# Patient Record
Sex: Female | Born: 1960 | Race: Black or African American | Hispanic: No | Smoking: Former smoker
Health system: Southern US, Community
[De-identification: ages and names within clinical notes are randomized; demographics above are authoritative.]

## PROBLEM LIST (undated history)

## (undated) DIAGNOSIS — I639 Cerebral infarction, unspecified: Secondary | ICD-10-CM

## (undated) HISTORY — PX: CEREBRAL ANEURYSM REPAIR: SHX164

## (undated) HISTORY — PX: PORTA CATH INSERTION: CATH118285

## (undated) HISTORY — PX: APPENDECTOMY: SHX54

## (undated) HISTORY — PX: MASTECTOMY: SHX3

## (undated) HISTORY — PX: LYMPH NODE DISSECTION: SHX5087

## (undated) HISTORY — PX: BREAST BIOPSY: SHX20

---

## 2013-11-19 ENCOUNTER — Emergency Department (HOSPITAL_COMMUNITY): Payer: Self-pay

## 2013-11-19 ENCOUNTER — Other Ambulatory Visit: Payer: Self-pay

## 2013-11-19 ENCOUNTER — Encounter (HOSPITAL_COMMUNITY): Payer: Self-pay | Admitting: Emergency Medicine

## 2013-11-19 ENCOUNTER — Emergency Department (HOSPITAL_COMMUNITY)
Admission: EM | Admit: 2013-11-19 | Discharge: 2013-11-19 | Disposition: A | Discharge status: Home / Self Care | Payer: Self-pay | Attending: Emergency Medicine | Admitting: Emergency Medicine

## 2013-11-19 DIAGNOSIS — R296 Repeated falls: Secondary | ICD-10-CM | POA: Insufficient documentation

## 2013-11-19 DIAGNOSIS — Y929 Unspecified place or not applicable: Secondary | ICD-10-CM | POA: Insufficient documentation

## 2013-11-19 DIAGNOSIS — W19XXXA Unspecified fall, initial encounter: Secondary | ICD-10-CM

## 2013-11-19 DIAGNOSIS — Z8673 Personal history of transient ischemic attack (TIA), and cerebral infarction without residual deficits: Secondary | ICD-10-CM | POA: Insufficient documentation

## 2013-11-19 DIAGNOSIS — F1010 Alcohol abuse, uncomplicated: Secondary | ICD-10-CM | POA: Insufficient documentation

## 2013-11-19 DIAGNOSIS — M2550 Pain in unspecified joint: Secondary | ICD-10-CM | POA: Insufficient documentation

## 2013-11-19 DIAGNOSIS — F10929 Alcohol use, unspecified with intoxication, unspecified: Secondary | ICD-10-CM

## 2013-11-19 DIAGNOSIS — Y939 Activity, unspecified: Secondary | ICD-10-CM | POA: Insufficient documentation

## 2013-11-19 HISTORY — DX: Cerebral infarction, unspecified: I63.9

## 2013-11-19 LAB — CBC WITH DIFFERENTIAL/PLATELET
BASOS ABS: 0 10*3/uL (ref 0.0–0.1)
BASOS PCT: 1 % (ref 0–1)
EOS ABS: 0.1 10*3/uL (ref 0.0–0.7)
EOS PCT: 2 % (ref 0–5)
HEMATOCRIT: 37.9 % (ref 36.0–46.0)
Hemoglobin: 12.7 g/dL (ref 12.0–15.0)
Lymphocytes Relative: 38 % (ref 12–46)
Lymphs Abs: 2.4 10*3/uL (ref 0.7–4.0)
MCH: 33.3 pg (ref 26.0–34.0)
MCHC: 33.5 g/dL (ref 30.0–36.0)
MCV: 99.5 fL (ref 78.0–100.0)
MONO ABS: 0.6 10*3/uL (ref 0.1–1.0)
Monocytes Relative: 10 % (ref 3–12)
Neutro Abs: 3.2 10*3/uL (ref 1.7–7.7)
Neutrophils Relative %: 49 % (ref 43–77)
Platelets: 191 10*3/uL (ref 150–400)
RBC: 3.81 MIL/uL — ABNORMAL LOW (ref 3.87–5.11)
RDW: 14.6 % (ref 11.5–15.5)
WBC: 6.5 10*3/uL (ref 4.0–10.5)

## 2013-11-19 LAB — URINALYSIS, ROUTINE W REFLEX MICROSCOPIC
BILIRUBIN URINE: NEGATIVE
Glucose, UA: NEGATIVE mg/dL
Hgb urine dipstick: NEGATIVE
KETONES UR: NEGATIVE mg/dL
NITRITE: NEGATIVE
Protein, ur: NEGATIVE mg/dL
SPECIFIC GRAVITY, URINE: 1.012 (ref 1.005–1.030)
UROBILINOGEN UA: 1 mg/dL (ref 0.0–1.0)
pH: 5.5 (ref 5.0–8.0)

## 2013-11-19 LAB — COMPREHENSIVE METABOLIC PANEL
ALBUMIN: 3.7 g/dL (ref 3.5–5.2)
ALK PHOS: 69 U/L (ref 39–117)
ALT: 9 U/L (ref 0–35)
AST: 19 U/L (ref 0–37)
BUN: 8 mg/dL (ref 6–23)
CO2: 20 mEq/L (ref 19–32)
CREATININE: 0.68 mg/dL (ref 0.50–1.10)
Calcium: 8.4 mg/dL (ref 8.4–10.5)
Chloride: 104 mEq/L (ref 96–112)
GFR calc Af Amer: >90 mL/min (ref >90)
GFR calc non Af Amer: >90 mL/min (ref >90)
Glucose, Bld: 91 mg/dL (ref 70–99)
POTASSIUM: 3.9 meq/L (ref 3.7–5.3)
Sodium: 142 mEq/L (ref 137–147)
Total Protein: 7.3 g/dL (ref 6.0–8.3)

## 2013-11-19 LAB — URINE MICROSCOPIC-ADD ON

## 2013-11-19 LAB — I-STAT TROPONIN, ED: TROPONIN I, POC: 0.01 ng/mL (ref 0.00–0.08)

## 2013-11-19 LAB — I-STAT CG4 LACTIC ACID, ED
Lactic Acid, Venous: 3.16 mmol/L — ABNORMAL HIGH (ref 0.5–2.2)
Lactic Acid, Venous: 2.89 mmol/L — ABNORMAL HIGH (ref 0.5–2.2)

## 2013-11-19 LAB — RAPID URINE DRUG SCREEN, HOSP PERFORMED
Amphetamines: NOT DETECTED
Barbiturates: NOT DETECTED
Benzodiazepines: NOT DETECTED
Cocaine: NOT DETECTED
Opiates: NOT DETECTED
Tetrahydrocannabinol: POSITIVE — AB

## 2013-11-19 LAB — AMMONIA: Ammonia: 25 umol/L (ref 11–60)

## 2013-11-19 LAB — ETHANOL: ALCOHOL ETHYL (B): 116 mg/dL — AB (ref 0–11)

## 2013-11-19 MED ORDER — LORAZEPAM 2 MG/ML IJ SOLN
0.5000 mg | Freq: Once | INTRAMUSCULAR | Status: AC
  Administered 2013-11-19: 0.5 mg via INTRAVENOUS
  Filled 2013-11-19: qty 1

## 2013-11-19 MED ORDER — SODIUM CHLORIDE 0.9 % IV BOLUS (SEPSIS)
1000.0000 mL | Freq: Once | INTRAVENOUS | Status: AC
  Administered 2013-11-19: 1000 mL via INTRAVENOUS

## 2013-11-19 MED ORDER — SODIUM CHLORIDE 0.9 % IV BOLUS (SEPSIS)
500.0000 mL | Freq: Once | INTRAVENOUS | Status: AC
  Administered 2013-11-19: 500 mL via INTRAVENOUS

## 2013-11-19 MED ORDER — FENTANYL CITRATE 0.05 MG/ML IJ SOLN
50.0000 ug | Freq: Once | INTRAMUSCULAR | Status: AC
  Administered 2013-11-19: 50 ug via INTRAVENOUS
  Filled 2013-11-19: qty 2

## 2013-11-19 NOTE — ED Notes (Signed)
New family members at bedside (son & guest). Son asking about plan of care and PT status. PA and RN explained and asked permission to send back to x-ray since PT had pain medication on board now and was more comfortable. Family agreed. PT agreed with PA and RN prior to pain meds being administered

## 2013-11-19 NOTE — Discharge Instructions (Signed)
Alcohol and Nutrition °Nutrition serves two purposes. It provides energy. It also maintains body structure and function. Food supplies energy. It also provides the building blocks needed to replace worn or damaged cells. Alcoholics often eat poorly. This limits their supply of essential nutrients. This affects energy supply and structure maintenance. Alcohol also affects the body's nutrients in: °· Digestion. °· Storage. °· Using and getting rid of waste products. °IMPAIRMENT OF NUTRIENT DIGESTION AND UTILIZATION  °· Once ingested, food must be broken down into small components (digested). Then it is available for energy. It helps maintain body structure and function. Digestion begins in the mouth. It continues in the stomach and intestines, with help from the pancreas. The nutrients from digested food are absorbed from the intestines into the blood. Then they are carried to the liver. The liver prepares nutrients for: °· Immediate use. °· Storage and future use. °· Alcohol inhibits the breakdown of nutrients into usable molecules. °· It decreases secretion of digestive enzymes from the pancreas. °· Alcohol impairs nutrient absorption by damaging the cells lining the stomach and intestines. °· It also interferes with moving some nutrients into the blood. °· In addition, nutritional deficiencies themselves may lead to further absorption problems. °· For example, folate deficiency changes the cells that line the small intestine. This impairs how water is absorbed. It also affects absorbed nutrients. These include glucose, sodium, and additional folate. °· Even if nutrients are digested and absorbed, alcohol can prevent them from being fully used. It changes their transport, storage, and excretion. Impaired utilization of nutrients by alcoholics is indicated by: °· Decreased liver stores of vitamins, such as vitamin A. °· Increased excretion of nutrients such as fat. °ALCOHOL AND ENERGY SUPPLY  °· Three basic  nutritional components found in food are: °· Carbohydrates. °· Proteins. °· Fats. °· These are used as energy. Some alcoholics take in as much as 50% of their total daily calories from alcohol. They often neglect important foods. °· Even when enough food is eaten, alcohol can impair the ways the body controls blood sugar (glucose) levels. It may either increase or decrease blood sugar. °· In non-diabetic alcoholics, increased blood sugar (hyperglycemia) is caused by poor insulin secretion. It is usually temporary. °· Decreased blood sugar (hypoglycemia) can cause serious injury even if this condition is short-lived. Low blood sugar can happen when a fasting or malnourished person drinks alcohol. When there is no food to supply energy, stored sugar is used up. The products of alcohol inhibit forming glucose from other compounds such as amino acids. As a result, alcohol causes the brain and other body tissue to lack glucose. It is needed for energy and function. °· Alcohol is an energy source. But how the body processes and uses the energy from alcohol is complex. Also, when alcohol is substituted for carbohydrates, subjects tend to lose weight. This indicates that they get less energy from alcohol than from food. °ALCOHOL - MAINTAINING CELL STRUCTURE AND FUNCTION  °Structure °Cells are made mostly of protein. So an adequate protein diet is important for maintaining cell structure. This is especially true if cells are being damaged. Research indicates that alcohol affects protein nutrition by causing impaired: °· Digestion of proteins to amino acids. °· Processing of amino acids by the small intestine and liver. °· Synthesis of proteins from amino acids. °· Protein secretion by the liver. °Function °Nutrients are essential for the body to function well. They provide the tools that the body needs to work well:  °·   Proteins.  Vitamins.  Minerals. Alcohol can disrupt body function. It may cause nutrient  deficiencies. And it may interfere with the way nutrients are processed. Vitamins  Vitamins are essential to maintain growth and normal metabolism. They regulate many of the body`s processes. Chronic heavy drinking causes deficiencies in many vitamins. This is caused by eating less. And, in some cases, vitamins may be poorly absorbed. For example, alcohol inhibits fat absorption. It impairs how the vitamins A, E, and D are normally absorbed along with dietary fats. Not enough vitamin A may cause night blindness. Not enough vitamin D may cause softening of the bones.  Some alcoholics lack vitamins A, C, D, E, K, and the B vitamins. These are all involved in wound healing and cell maintenance. In particular, because vitamin K is necessary for blood clotting, lacking that vitamin can cause delayed clotting. The result is excess bleeding. Lacking other vitamins involved in brain function may cause severe neurological damage. Minerals Deficiencies of minerals such as calcium, magnesium, iron, and zinc are common in alcoholics. The alcohol itself does not seem to affect how these minerals are absorbed. Rather, they seem to occur secondary to other alcohol-related problems, such as:  Less calcium absorbed.  Not enough magnesium.  More urinary excretion.  Vomiting.  Diarrhea.  Not enough iron due to gastrointestinal bleeding.  Not enough zinc or losses related to other nutrient deficiencies.  Mineral deficiencies can cause a variety of medical consequences. These range from calcium-related bone disease to zinc-related night blindness and skin lesions. ALCOHOL, MALNUTRITION, AND MEDICAL COMPLICATIONS  Liver Disease   Alcoholic liver damage is caused primarily by alcohol itself. But poor nutrition may increase the risk of alcohol-related liver damage. For example, nutrients normally found in the liver are known to be affected by drinking alcohol. These include carotenoids, which are the major  sources of vitamin A, and vitamin E compounds. Decreases in such nutrients may play some role in alcohol-related liver damage. Pancreatitis  Research suggests that malnutrition may increase the risk of developing alcoholic pancreatitis. Research suggests that a diet lacking in protein may increase alcohol's damaging effect on the pancreas. Brain  Nutritional deficiencies may have severe effects on brain function. These may be permanent. Specifically, thiamine deficiencies are often seen in alcoholics. They can cause severe neurological problems. These include:  Impaired movement.  Memory loss seen in Wernicke-Korsakoff syndrome. Pregnancy  Alcohol has toxic effects on fetal development. It causes alcohol-related birth defects. They include fetal alcohol syndrome. Alcohol itself is toxic to the fetus. Also, the nutritional deficiency can affect how the fetus develops. That may compound the risk of developmental damage.  Nutritional needs during pregnancy are 10% to 30% greater than normal. Food intake can increase by as much as 140% to cover the needs of both mother and fetus. An alcoholic mother`s nutritional problems may adversely affect the nutrition of the fetus. And alcohol itself can also restrict nutrition flow to the fetus. NUTRITIONAL STATUS OF ALCOHOLICS  Techniques for assessing nutritional status include:  Taking body measurements to estimate fat reserves. They include:  Weight.  Height.  Mass.  Skin fold thickness.  Performing blood analysis to provide measurements of circulating:  Proteins.  Vitamins.  Minerals.  These techniques tend to be imprecise. For many nutrients, there is no clear "cut-off" point that would allow an accurate definition of deficiency. So assessing the nutritional status of alcoholics is limited by these techniques. Dietary status may provide information about the risk of developing nutritional problems.  Dietary status is assessed by:  Taking  patients' dietary histories.  Evaluating the amount and types of food they are eating.  It is difficult to determine what exact amount of alcohol begins to have damaging effects on nutrition. In general, moderate drinkers have 2 drinks or less per day. They seem to be at little risk for nutritional problems. Various medical disorders begin to appear at greater levels.  Research indicates that the majority of even the heaviest drinkers have few obvious nutritional deficiencies. Many alcoholics who are hospitalized for medical complications of their disease do have severe malnutrition. Alcoholics tend to eat poorly. Often they eat less than the amounts of food necessary to provide enough:  Carbohydrates.  Protein.  Fat.  Vitamins A and C.  B vitamins.  Minerals like calcium and iron. Of major concern is alcohol's effect on digesting food and use of nutrients. It may shift a mildly malnourished person toward severe malnutrition. Document Released: 04/09/2005 Document Revised: 09/07/2011 Document Reviewed: 09/23/2005 [facility] Patient Information 2014 [place]. Muscle Strain A muscle strain is an injury that occurs when a muscle is stretched beyond its normal length. Usually a small number of muscle fibers are torn when this happens. Muscle strain is rated in degrees. First-degree strains have the least amount of muscle fiber tearing and pain. Second-degree and third-degree strains have increasingly more tearing and pain.  Usually, recovery from muscle strain takes 1 2 weeks. Complete healing takes 5 6 weeks.  CAUSES  Muscle strain happens when a sudden, violent force placed on a muscle stretches it too far. This may occur with lifting, sports, or a fall.  RISK FACTORS Muscle strain is especially common in athletes.  SIGNS AND SYMPTOMS At the site of the muscle strain, there may be:  Pain.  Bruising.  Swelling.  Difficulty using the muscle due to pain or lack of normal  function. DIAGNOSIS  Your health care provider will perform a physical exam and ask about your medical history. TREATMENT  Often, the best treatment for a muscle strain is resting, icing, and applying cold compresses to the injured area.  HOME CARE INSTRUCTIONS   Use the PRICE method of treatment to promote muscle healing during the first 2 3 days after your injury. The PRICE method involves:  Protecting the muscle from being injured again.  Restricting your activity and resting the injured body part.  Icing your injury. To do this, put ice in a plastic bag. Place a towel between your skin and the bag. Then, apply the ice and leave it on from 15 20 minutes each hour. After the third day, switch to moist heat packs.  Apply compression to the injured area with a splint or elastic bandage. Be careful not to wrap it too tightly. This may interfere with blood circulation or increase swelling.  Elevate the injured body part above the level of your heart as often as you can.  Only take over-the-counter or prescription medicines for pain, discomfort, or fever as directed by your health care provider.  Warming up prior to exercise helps to prevent future muscle strains. SEEK MEDICAL CARE IF:   You have increasing pain or swelling in the injured area.  You have numbness, tingling, or a significant loss of strength in the injured area. MAKE SURE YOU:   Understand these instructions.  Will watch your condition.  Will get help right away if you are not doing well or get worse. Document Released: 06/15/2005 Document Revised: 04/05/2013 Document Reviewed: 01/12/2013  [employer] Patient Information 2014 [place].

## 2013-11-19 NOTE — ED Notes (Addendum)
Dr Tamera Punt given a copy of lactic acid results 2.89

## 2013-11-19 NOTE — ED Provider Notes (Signed)
CVA history. Right side deficits. Speech unclear at baseline. Heavy alcohol use. Fell while walking on sidewalk. ? AMS, improved with narcan via EMS. CT, xray, labs negative. LA 3, ETOH 160. UDS pending. 2 liters then repeat LA - if normal discharge home. LA high - admit for AMS and lactic acidosis.  Re-check - Lactic acid improving. She has stable mental status over time. She is able to answer questions appropriately. Family at bedside reports able to care for her at home. Doubt neurologic source of questionable AMS, likely due to alcohol and marijuana use. Stable for discharge.   Dewaine Oats, PA-C 11/19/13 1946

## 2013-11-19 NOTE — ED Notes (Signed)
Abnormal labs given to PA. Melanie Valentine  Lactic Acid 3.16 mmol/L

## 2013-11-19 NOTE — ED Provider Notes (Signed)
CSN: [id number]     Arrival date & time 11/19/13  1040 History   First MD Initiated Contact with Patient 11/19/13 1040     Chief Complaint  Patient presents with  . Altered Mental Status     (Consider location/radiation/quality/duration/timing/severity/associated sxs/prior Treatment) HPI Melanie Valentine is a 53 y.o. female who was brought in by EMS for altered mental status. According to EMS, patient was found walking on the side of the road and falling several times, by bystanders. [place]. Upon EMS arrival patient was going in and out of responsiveness. Patient received  Narcan and was more responsive after. Patient did admit to the drink alcohol, however denied it to me. 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 Patient is alert and talking, however speech is difficult to comprehend. Patient is tearful. She keeps saying she wants to go to heaven. She states when she fell she landed on her right arm and is having a right wrist and right hand pain as well as lumbar spine pain. She did not hit her head or lose consciousness. According to EMS patient's family is on the way to the hospital.  Past Medical History  Diagnosis Date  . Stroke     2014   History reviewed. No pertinent past surgical history. No family history on file. History  Substance Use Topics  . Smoking status: Unknown If Ever Smoked  . Smokeless tobacco: Not on file  . Alcohol Use: Yes   OB History   Grav Para Term Preterm Abortions TAB SAB Ect Mult Living                 Review of Systems  Unable to perform ROS: Mental status change  Musculoskeletal: Positive for arthralgias.  Neurological: Negative for dizziness.  All other systems reviewed and are negative.     Allergies  Review of patient's allergies indicates no known allergies.  Home Medications   Prior to Admission medications    Not on File   BP 119/82  Pulse 85  Temp(Src) 97.8 F (36.6 C) (Oral)  Resp 23  SpO2 92% Physical Exam  Nursing note and vitals reviewed. Constitutional: She appears well-developed and well-nourished.  Tearful  HENT:  Head: Normocephalic and atraumatic.  Mouth/Throat: Oropharynx is clear and moist. No oropharyngeal exudate.  Eyes: Conjunctivae are normal. Pupils are equal, round, and reactive to light.  Unable to look to the left  Neck: Normal range of motion. Neck supple.  Cardiovascular: Normal rate, regular rhythm and normal heart sounds.   Pulmonary/Chest: Effort normal and breath sounds normal. No respiratory distress. She has no wheezes. She has no rales.  Abdominal: Soft. Bowel sounds are normal. She exhibits no distension. There is no tenderness. There is no rebound.  Musculoskeletal: She exhibits no edema.  No swelling or bruising noted over her right forearm, wrist, hand. Tender to palpation diffusely over right wrist and hand. Tenderness over midline lumbar spine. Cervical and thoracic spine nontender.  Neurological: She is alert.  Unable to recall location, year of birth, current year, her name.  right arm and leg weakness, chronic. Unable to shrug right shoulder, refusing to look to the left, cranial nerves otherwise intact.  Skin: Skin is warm and dry.  Psychiatric: She has a normal mood and affect. Her behavior is normal.    ED Course  Procedures (including critical care time) Labs Review Labs  Reviewed  COMPREHENSIVE METABOLIC PANEL - Abnormal; Notable for the following:    Total Bilirubin <0.2 (*)    All other components within normal limits  URINALYSIS, ROUTINE W REFLEX MICROSCOPIC - Abnormal; Notable for the following:    APPearance CLOUDY (*)    Leukocytes, UA TRACE (*)    All other components within normal limits  URINE RAPID DRUG SCREEN (HOSP PERFORMED) - Abnormal; Notable for the following:    Tetrahydrocannabinol POSITIVE (*)    All other components  within normal limits  ETHANOL - Abnormal; Notable for the following:    Alcohol, Ethyl (B) 116 (*)    All other components within normal limits  URINE MICROSCOPIC-ADD ON - Abnormal; Notable for the following:    Squamous Epithelial / LPF FEW (*)    Casts HYALINE CASTS (*)    All other components within normal limits  CBC WITH DIFFERENTIAL - Abnormal; Notable for the following:    RBC 3.81 (*)    All other components within normal limits  I-STAT CG4 LACTIC ACID, ED - Abnormal; Notable for the following:    Lactic Acid, Venous 3.16 (*)    All other components within normal limits  I-STAT CG4 LACTIC ACID, ED - Abnormal; Notable for the following:    Lactic Acid, Venous 2.89 (*)    All other components within normal limits  URINE CULTURE  AMMONIA  CBC WITH DIFFERENTIAL  I-STAT TROPOININ, ED    Imaging Review Dg Chest 2 View  11/19/2013   CLINICAL DATA:  Altered mental status  EXAM: CHEST  2 VIEW  COMPARISON:  None.  FINDINGS: Low inspiratory volumes. The Lungs are clear and negative for focal airspace consolidation, pulmonary edema or suspicious pulmonary nodule. No pleural effusion or pneumothorax. Cardiac and mediastinal contours are within normal limits. No acute fracture or lytic or blastic osseous lesions. The visualized upper abdominal bowel gas pattern is unremarkable.  IMPRESSION: No active cardiopulmonary disease.   Electronically Signed   By: Jacqulynn Cadet M.D.   On: 11/19/2013 12:45   Dg Lumbar Spine Complete  11/19/2013   CLINICAL DATA:  Altered mental status.  Fall.  EXAM: LUMBAR SPINE - COMPLETE 4+ VIEW  COMPARISON:  None.  FINDINGS: There are 5 lumbar type vertebral bodies. There is a slight convex left curvature of the lumbar spine. Disc height narrowing at L4-L5 and L5-S1, with endplate spurring at these levels. No fracture or pars defect. Sacroiliac joints unremarkable. Bowel gas pattern is nonobstructive.  IMPRESSION: Mild convex left scoliosis of lumbar spine. No acute  bony abnormality.  Degenerative disc disease at L4-5 and L5-S1.   Electronically Signed   By: Curlene Dolphin M.D.   On: 11/19/2013 15:39   Dg Elbow Complete Left  11/19/2013   CLINICAL DATA:  Altered mental status.  Fall.  EXAM: LEFT ELBOW - COMPLETE 3+ VIEW  COMPARISON:  None.  FINDINGS: Intravenous catheter noted in the proximal left forearm. Elbow is located. No evidence joint effusion. Joint space is maintained. Negative for fracture focal osseous lesion.  IMPRESSION: Negative.   Electronically Signed   By: Curlene Dolphin M.D.   On: 11/19/2013 15:40   Dg Wrist Complete Right  11/19/2013   CLINICAL DATA:  Altered mental status, witnessed fall to right side  EXAM: RIGHT WRIST - COMPLETE 3+ VIEW  COMPARISON:  None.  FINDINGS: Flexion contracture of the right wrist. No acute fracture. 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 No inflammatory erosions. The soft tissues are unremarkable.  IMPRESSION: 1. No acute fracture or malalignment. 2. 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   Electronically Signed   By: Jacqulynn Cadet M.D.   On: 11/19/2013 12:46   Ct Head Wo Contrast  11/19/2013   CLINICAL DATA:  Status post fall.  EXAM: CT HEAD WITHOUT CONTRAST  TECHNIQUE: Contiguous axial images were obtained from the base of the skull through the vertex without intravenous contrast.  COMPARISON:  None.  FINDINGS: Encephalomalacia demonstrated within the left MCA territory. Ex vacuo dilatation left lateral ventricle. Aneurysm clipping material demonstrated within the left insular region. Encephalomalacia within the posterior right periventricular white matter. Subcortical and periventricular matter hypodensity compatible with chronic small vessel ischemic disease. No evidence for acute cortically based infarct, intracranial hemorrhage or significant mass effect. The orbits are unremarkable. Mastoid air cells are well aerated. Paranasal sinuses are unremarkable. Prior  left frontotemporal craniotomy.  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  No definite evidence for acute intracranial process.   Electronically Signed   By: Lovey Newcomer M.D.   On: 11/19/2013 11:54   Dg Hand Complete Right  11/19/2013   CLINICAL DATA:  Altered mental status. Pain. Arm contracted tight begins chest.  EXAM: RIGHT HAND - COMPLETE 3+ VIEW  COMPARISON:  None.  FINDINGS: The bones are diffusely osteopenic. The metacarpal phalangeal joints are distended, which may be due to contracture. No acute or healing fracture is identified. No significant joint space abnormality. No focal soft tissue swelling appreciated.  IMPRESSION: No acute bony abnormality. Osteopenia. Extension of the metacarpophalangeal joints may be due to contracture.   Electronically Signed   By: Curlene Dolphin M.D.   On: 11/19/2013 15:37     EKG Interpretation None      MDM   Final diagnoses:  Fall  Alcohol intoxication  History of CVA (cerebrovascular accident)      1:07 PM Delay in obtaining blood, pt uncooperative. Also pulled out her IV. I spoke with pt, she now agrees to both. Will get RN to try again.    2:22 PM Called the lab, apparently blood work is still in the tube in the tube station. Will run now  4:03 PM Alcohol elevated. Normal electrolytes otherwise. X-rays normal. According to family, pt is at baseline mental status wise. Lactic acid elevated. No signs of infection. Will rehydrate, 2L of NS ordered. Will recheck lactic acid.   Renold Genta, PA-C 11/20/13 1554

## 2013-11-19 NOTE — ED Notes (Signed)
MD at bedside. 

## 2013-11-19 NOTE — ED Provider Notes (Signed)
Medical screening examination/treatment/procedure(s) were performed by non-physician practitioner and as supervising physician I was immediately available for consultation/collaboration.   EKG Interpretation None        Malvin Johns, MD 11/19/13 2340

## 2013-11-19 NOTE — ED Notes (Addendum)
Per EMS- pt was found walking on the side of the road. Pt was seen by bystanders to fall and land on rt arm. They stated that she did not hit her head.  Pt was going in and out of responsiveness upon EMS arrival. Pt reports ETOH use today. Pt received 0.5mg  narcan. Pt has had previous stroke last year. Pt verbal but difficult to understand. Pt states that she does not speak. Pt states I want to go to heaven. Per EMS pt was asking for her mother who is dead. Pt will follow commands with prompting.

## 2013-11-19 NOTE — ED Notes (Signed)
PT pulled out IV in L hand. PT then refused new IV or blood draw. Provider notified and came to talk to PT. PT then agreed to care (IV Labs, x-rays).

## 2013-11-20 LAB — URINE CULTURE

## 2013-11-20 NOTE — ED Provider Notes (Signed)
Medical screening examination/treatment/procedure(s) were performed by non-physician practitioner and as supervising physician I was immediately available for consultation/collaboration.   EKG Interpretation None        Wandra Arthurs, MD 11/20/13 [phone]

## 2014-09-26 IMAGING — CR DG LUMBAR SPINE COMPLETE 4+V
5 series · 5 of 5 positions shown · non-contrast
Comparison: None.

CLINICAL DATA: Altered mental status.  Fall.

EXAM:
LUMBAR SPINE - COMPLETE 4+ VIEW

[t l-spine lat]
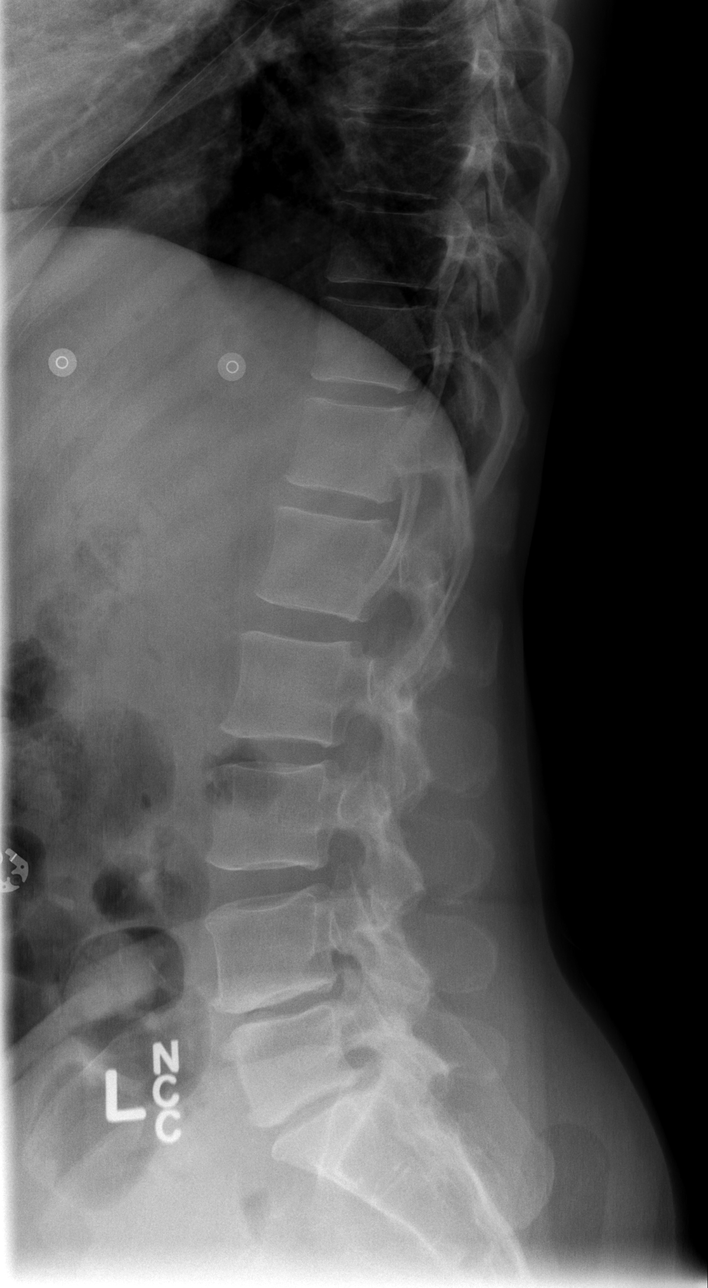

[t l-spine l5-s1 spot]
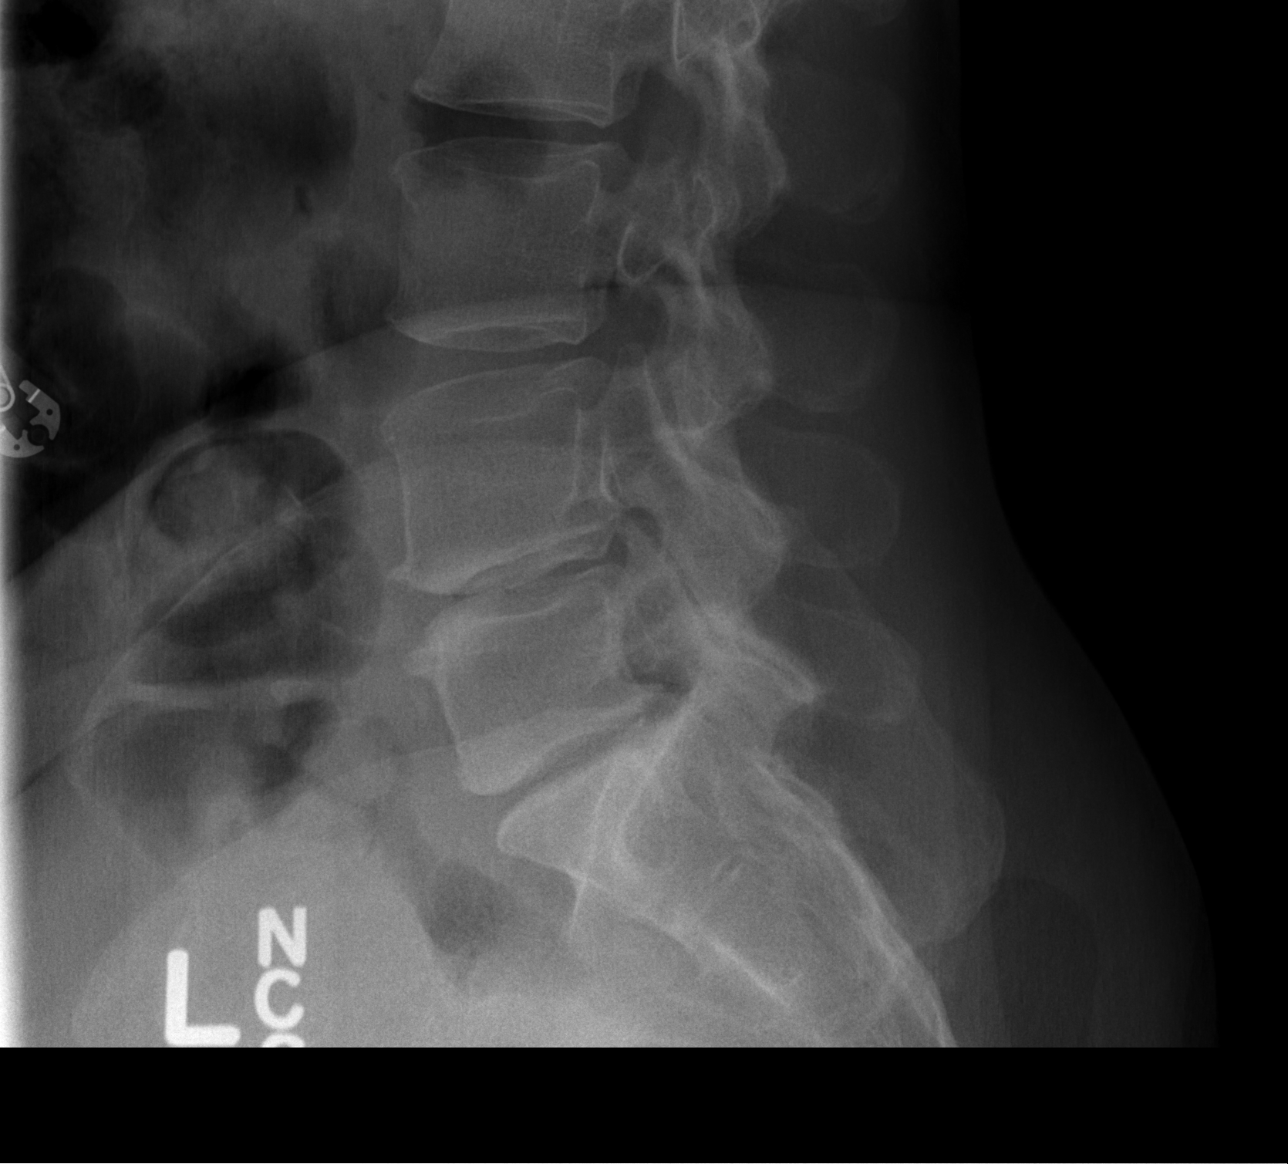

[t l-spine a.p.]
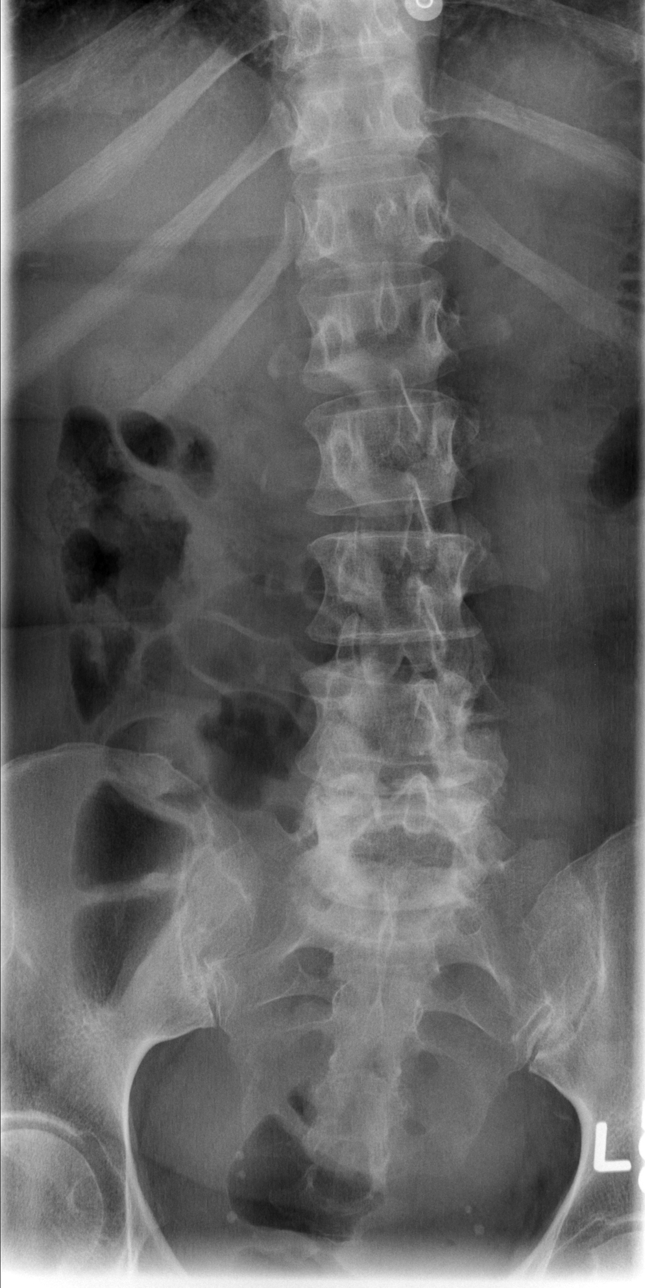

[t l-spine oblique exposure (1 of 2)]
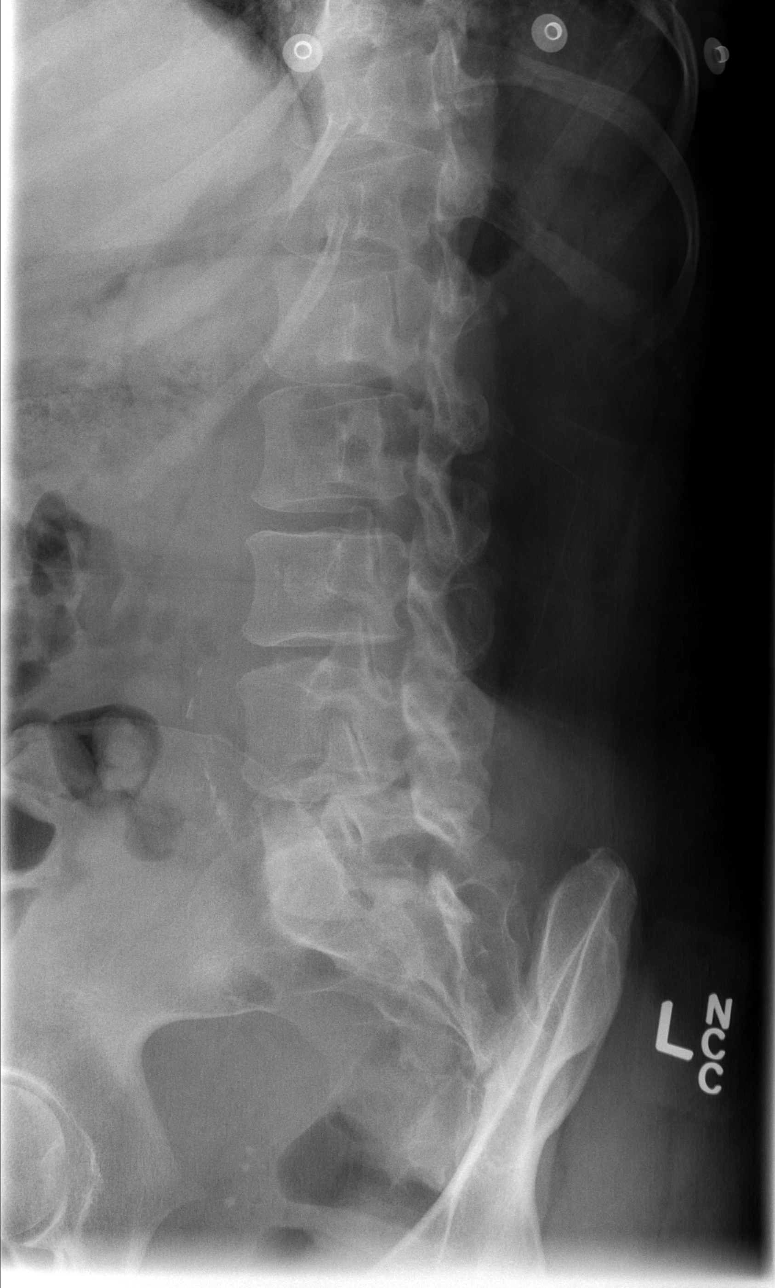

[t l-spine oblique exposure (2 of 2)]
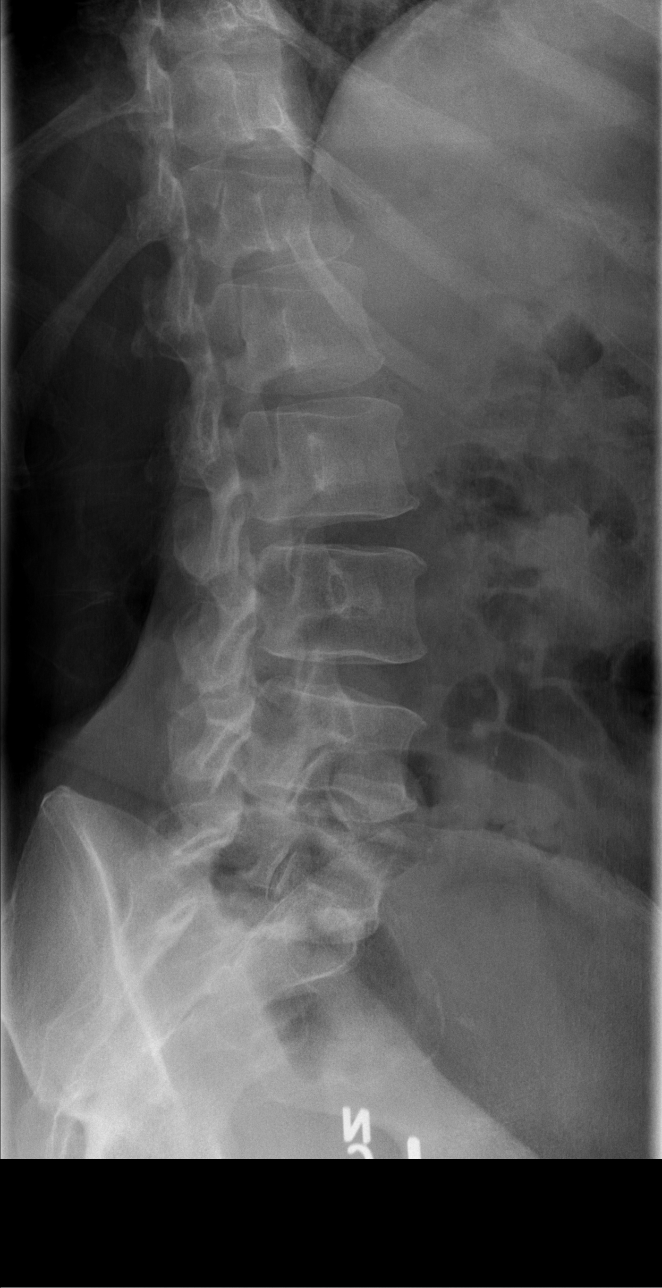

[5 of 5 positions shown; findings below may reference images not displayed]

FINDINGS: There are 5 lumbar type vertebral bodies. There is a slight convex
left curvature of the lumbar spine. Disc height narrowing at L4-L5
and L5-S1, with endplate spurring at these levels. No fracture or
pars defect. Sacroiliac joints unremarkable. Bowel gas pattern is
nonobstructive.
IMPRESSION: Mild convex left scoliosis of lumbar spine. No acute bony
abnormality.

Degenerative disc disease at L4-5 and L5-S1.

## 2014-09-26 IMAGING — CR DG HAND COMPLETE 3+V*R*
3 series · 3 of 3 positions shown · non-contrast
Comparison: None.

CLINICAL DATA: Altered mental status. Pain. Arm contracted tight
begins chest.

EXAM:
RIGHT HAND - COMPLETE 3+ VIEW

[view not recorded (1 of 3)]
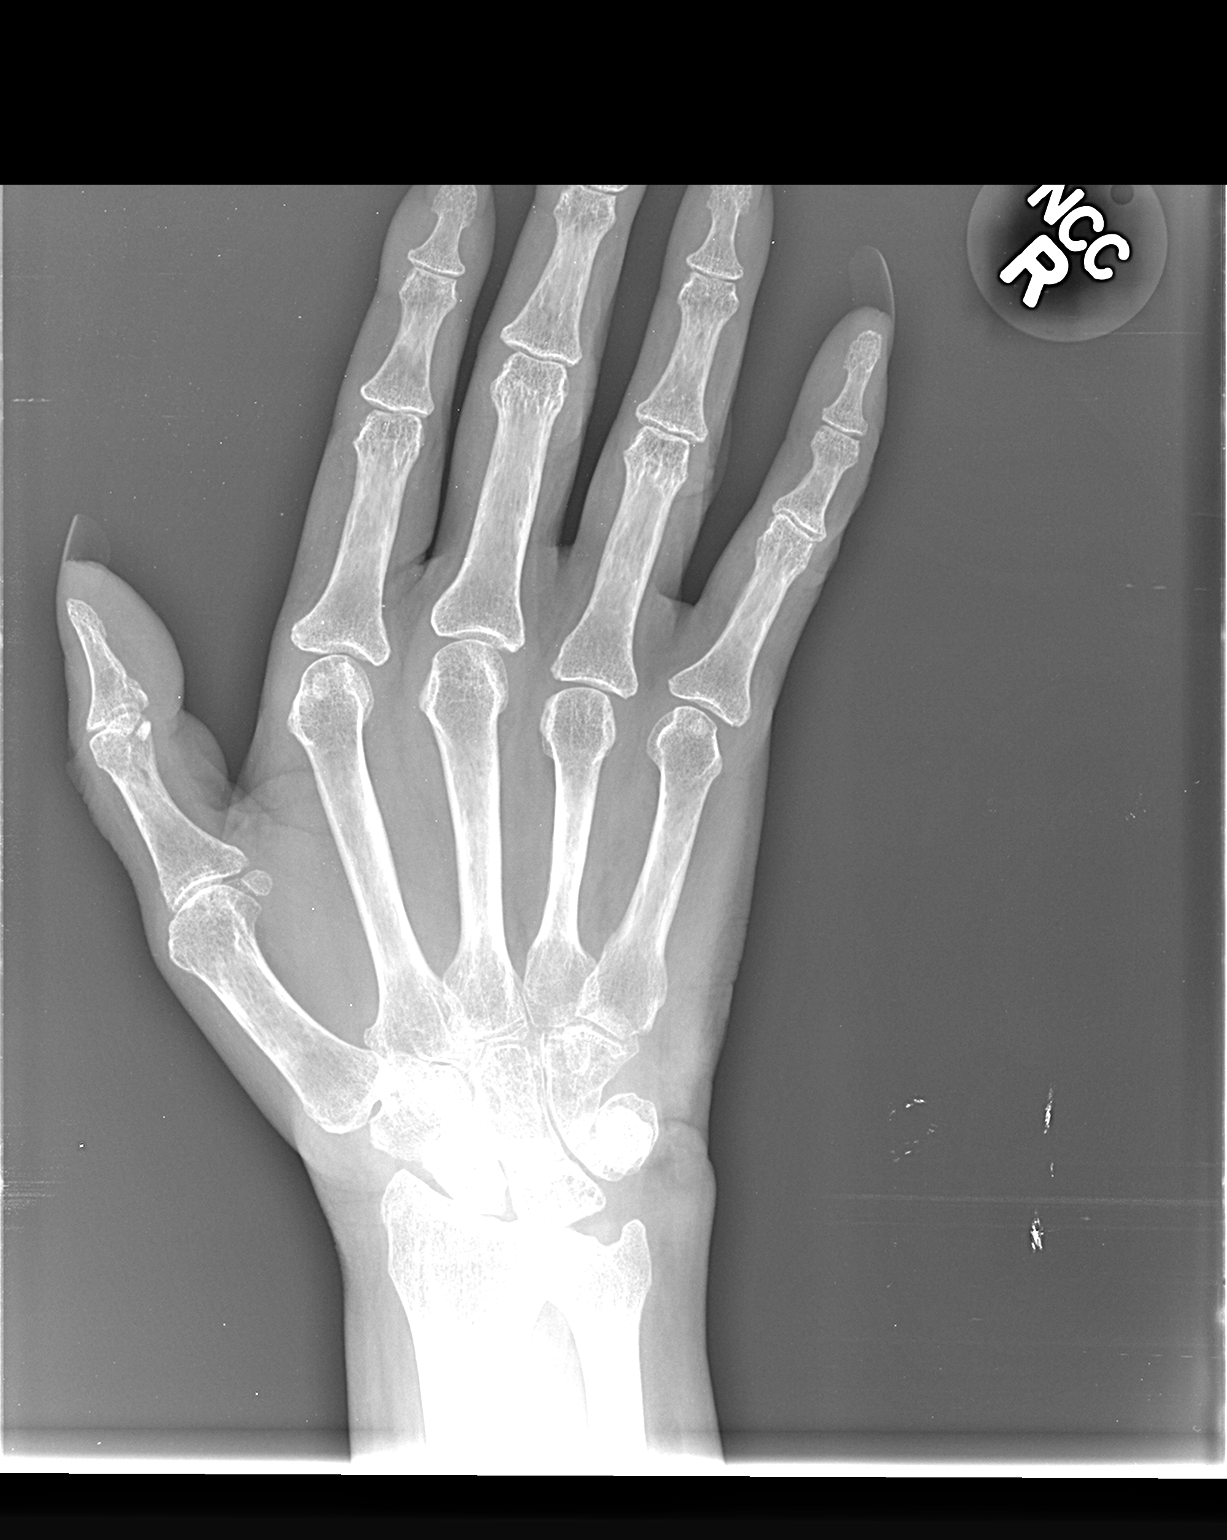

[view not recorded (2 of 3)]
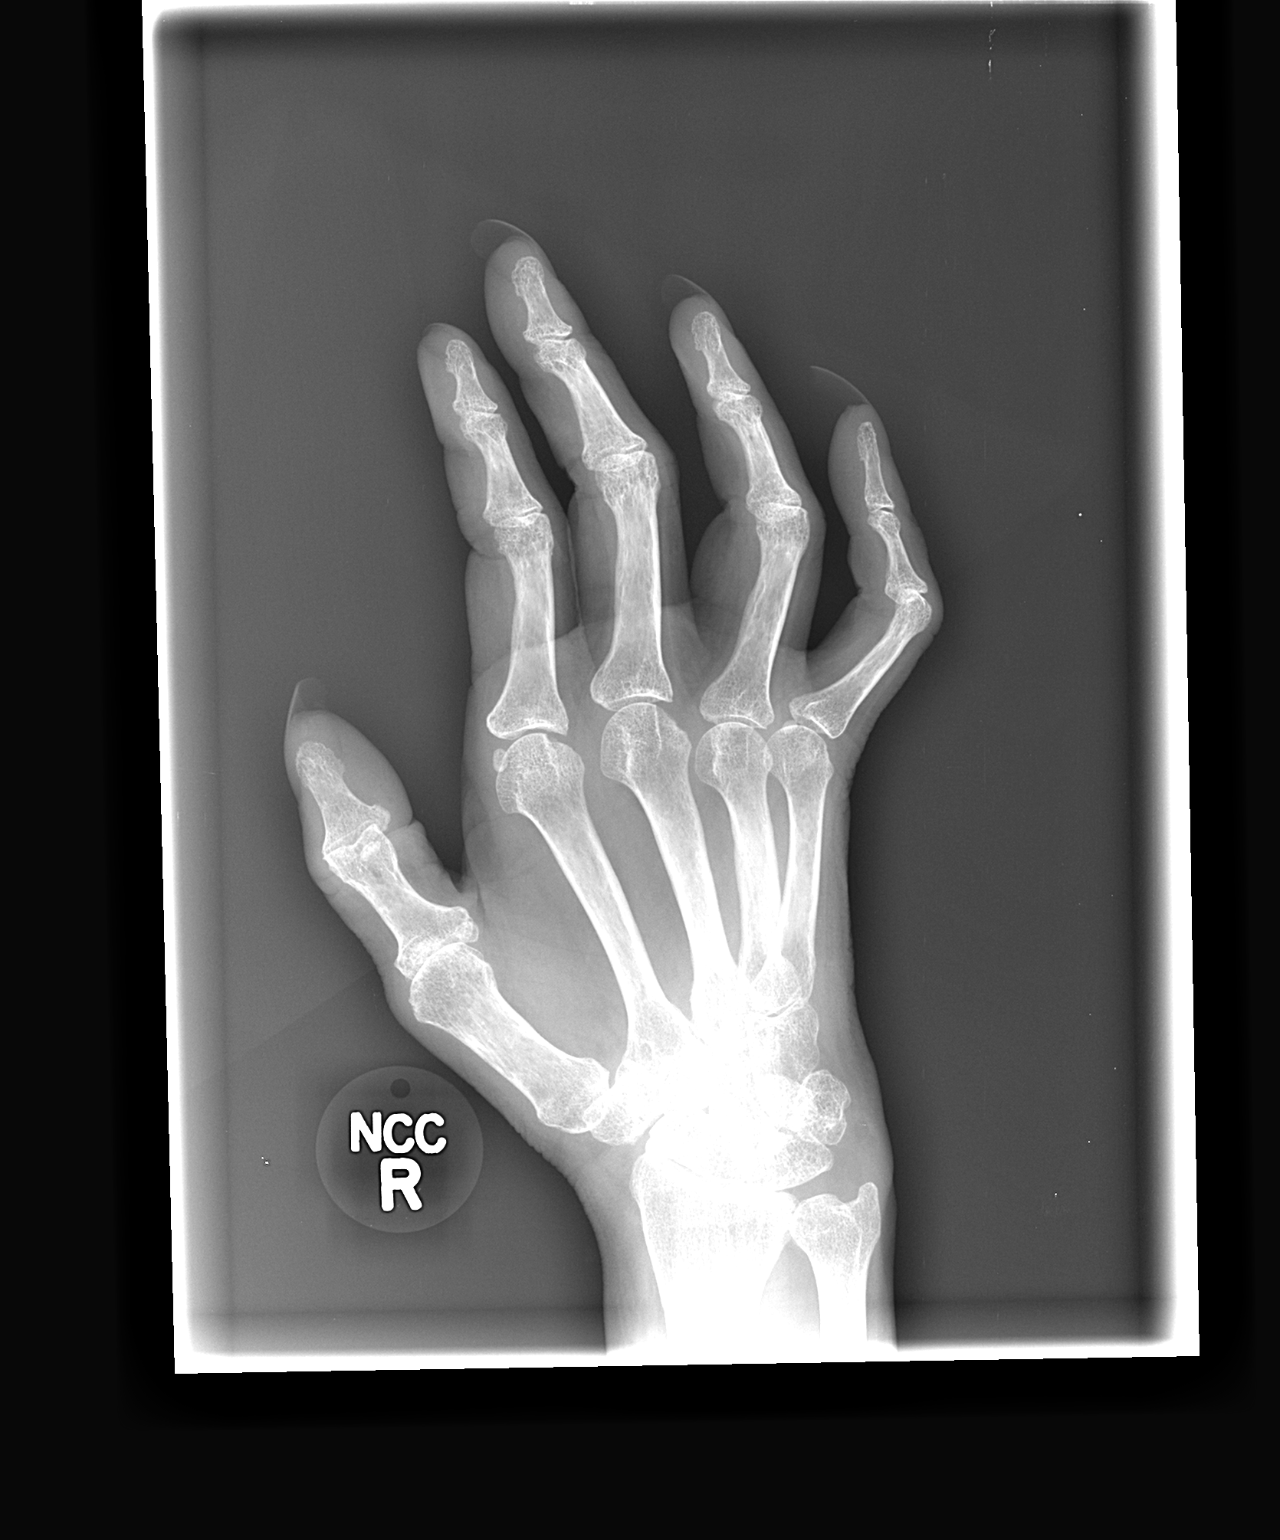

[view not recorded (3 of 3)]
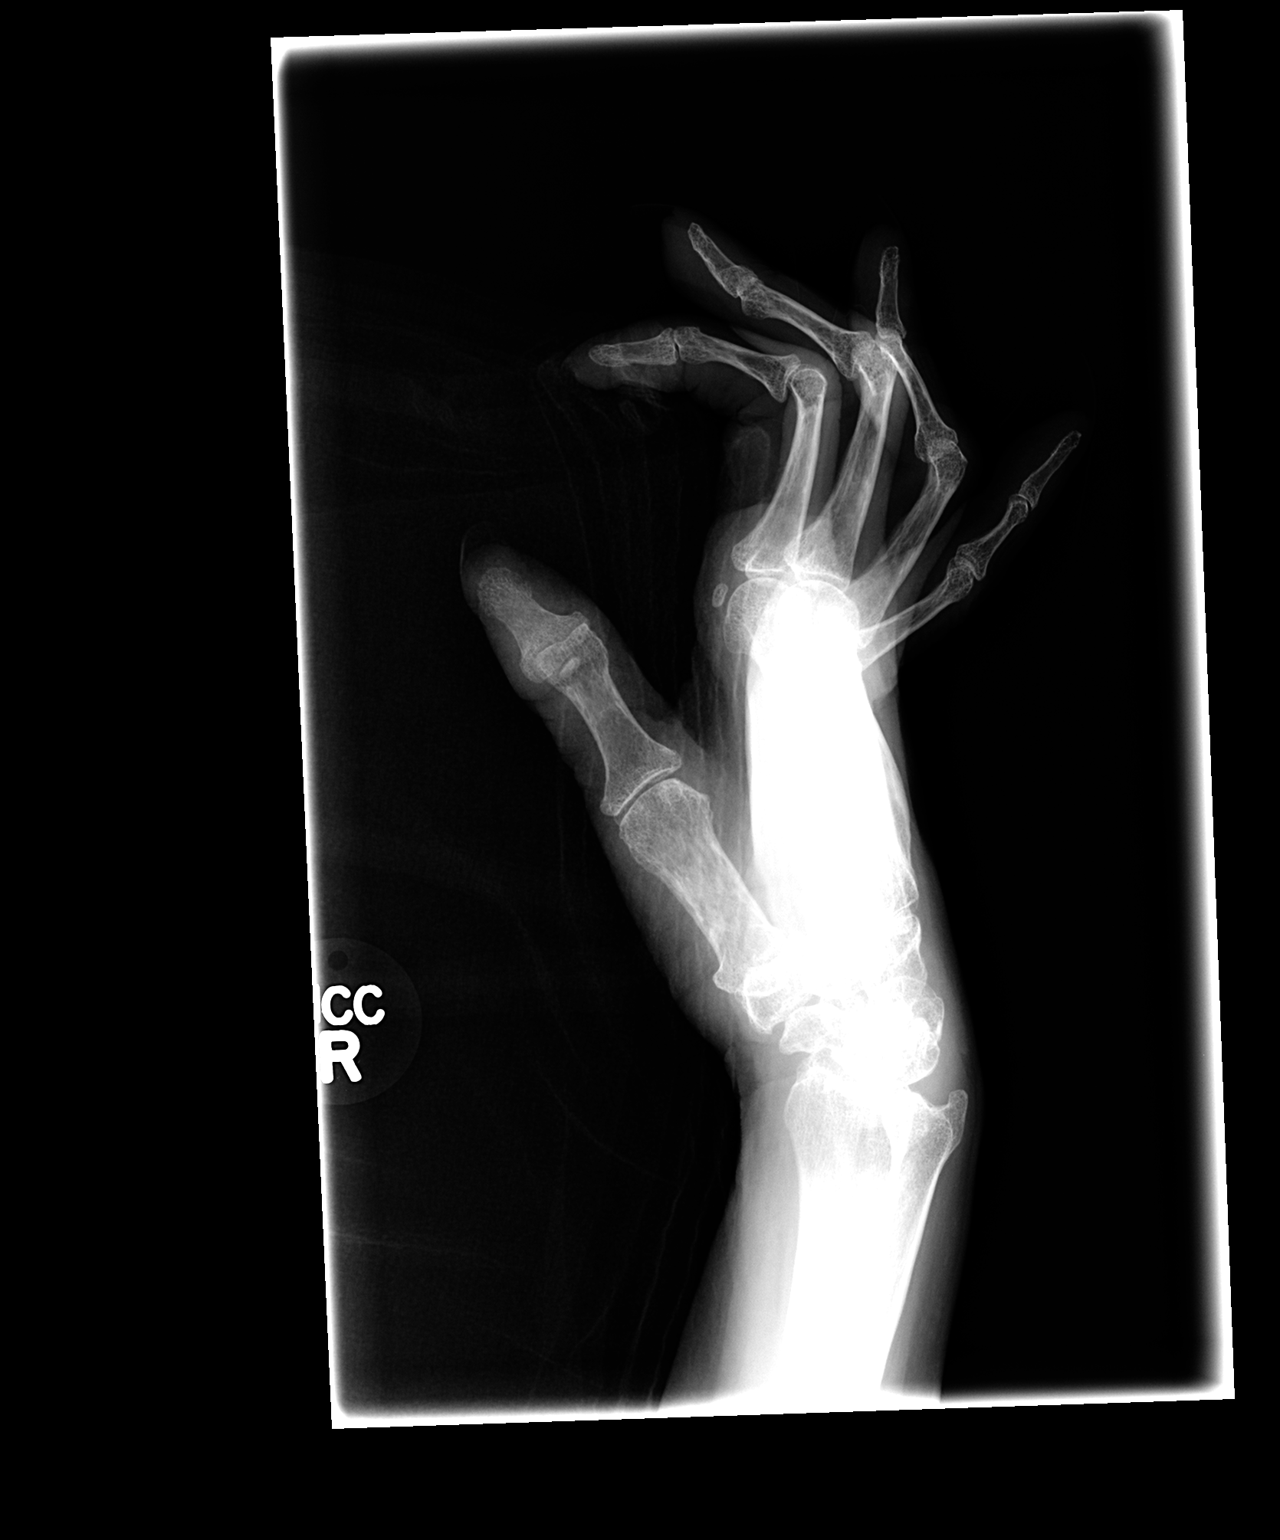

[3 of 3 positions shown; findings below may reference images not displayed]

FINDINGS: The bones are diffusely osteopenic. The metacarpal phalangeal joints
are distended, which may be due to contracture. No acute or healing
fracture is identified. No significant joint space abnormality. No
focal soft tissue swelling appreciated.
IMPRESSION: No acute bony abnormality. Osteopenia. Extension of the
metacarpophalangeal joints may be due to contracture.

## 2016-03-09 DIAGNOSIS — C50912 Malignant neoplasm of unspecified site of left female breast: Secondary | ICD-10-CM | POA: Diagnosis not present

## 2016-04-08 DIAGNOSIS — C50412 Malignant neoplasm of upper-outer quadrant of left female breast: Secondary | ICD-10-CM | POA: Diagnosis not present

## 2016-04-08 DIAGNOSIS — C773 Secondary and unspecified malignant neoplasm of axilla and upper limb lymph nodes: Secondary | ICD-10-CM | POA: Diagnosis not present

## 2016-04-08 DIAGNOSIS — Z171 Estrogen receptor negative status [ER-]: Secondary | ICD-10-CM | POA: Diagnosis not present

## 2016-04-15 DIAGNOSIS — Z171 Estrogen receptor negative status [ER-]: Secondary | ICD-10-CM | POA: Diagnosis not present

## 2016-04-15 DIAGNOSIS — C50412 Malignant neoplasm of upper-outer quadrant of left female breast: Secondary | ICD-10-CM | POA: Diagnosis not present

## 2016-04-15 DIAGNOSIS — C773 Secondary and unspecified malignant neoplasm of axilla and upper limb lymph nodes: Secondary | ICD-10-CM | POA: Diagnosis not present

## 2016-04-27 DIAGNOSIS — F418 Other specified anxiety disorders: Secondary | ICD-10-CM

## 2016-04-27 DIAGNOSIS — I10 Essential (primary) hypertension: Secondary | ICD-10-CM

## 2016-04-27 DIAGNOSIS — R55 Syncope and collapse: Secondary | ICD-10-CM | POA: Diagnosis not present

## 2016-04-27 DIAGNOSIS — C50919 Malignant neoplasm of unspecified site of unspecified female breast: Secondary | ICD-10-CM

## 2016-04-27 DIAGNOSIS — I69359 Hemiplegia and hemiparesis following cerebral infarction affecting unspecified side: Secondary | ICD-10-CM

## 2016-04-27 DIAGNOSIS — E875 Hyperkalemia: Secondary | ICD-10-CM

## 2016-04-27 DIAGNOSIS — D709 Neutropenia, unspecified: Secondary | ICD-10-CM

## 2016-04-28 DIAGNOSIS — C50919 Malignant neoplasm of unspecified site of unspecified female breast: Secondary | ICD-10-CM | POA: Diagnosis not present

## 2016-04-28 DIAGNOSIS — R55 Syncope and collapse: Secondary | ICD-10-CM | POA: Diagnosis not present

## 2016-04-28 DIAGNOSIS — I69359 Hemiplegia and hemiparesis following cerebral infarction affecting unspecified side: Secondary | ICD-10-CM | POA: Diagnosis not present

## 2016-04-28 DIAGNOSIS — D709 Neutropenia, unspecified: Secondary | ICD-10-CM | POA: Diagnosis not present

## 2016-04-29 DIAGNOSIS — D709 Neutropenia, unspecified: Secondary | ICD-10-CM

## 2016-04-29 DIAGNOSIS — C50919 Malignant neoplasm of unspecified site of unspecified female breast: Secondary | ICD-10-CM

## 2016-04-29 DIAGNOSIS — I10 Essential (primary) hypertension: Secondary | ICD-10-CM

## 2016-04-29 DIAGNOSIS — F418 Other specified anxiety disorders: Secondary | ICD-10-CM

## 2016-04-29 DIAGNOSIS — I69359 Hemiplegia and hemiparesis following cerebral infarction affecting unspecified side: Secondary | ICD-10-CM

## 2016-04-29 DIAGNOSIS — E875 Hyperkalemia: Secondary | ICD-10-CM

## 2016-04-29 DIAGNOSIS — R55 Syncope and collapse: Secondary | ICD-10-CM | POA: Diagnosis not present

## 2016-05-11 DIAGNOSIS — Z171 Estrogen receptor negative status [ER-]: Secondary | ICD-10-CM | POA: Diagnosis not present

## 2016-05-11 DIAGNOSIS — C50412 Malignant neoplasm of upper-outer quadrant of left female breast: Secondary | ICD-10-CM | POA: Diagnosis not present

## 2016-05-11 DIAGNOSIS — L658 Other specified nonscarring hair loss: Secondary | ICD-10-CM | POA: Diagnosis not present

## 2016-06-01 DIAGNOSIS — C50412 Malignant neoplasm of upper-outer quadrant of left female breast: Secondary | ICD-10-CM | POA: Diagnosis not present

## 2016-07-22 DIAGNOSIS — Z9012 Acquired absence of left breast and nipple: Secondary | ICD-10-CM

## 2016-07-22 DIAGNOSIS — C50412 Malignant neoplasm of upper-outer quadrant of left female breast: Secondary | ICD-10-CM | POA: Diagnosis not present

## 2016-07-22 DIAGNOSIS — Z171 Estrogen receptor negative status [ER-]: Secondary | ICD-10-CM

## 2016-07-22 DIAGNOSIS — I890 Lymphedema, not elsewhere classified: Secondary | ICD-10-CM | POA: Diagnosis not present

## 2016-10-20 DIAGNOSIS — Z853 Personal history of malignant neoplasm of breast: Secondary | ICD-10-CM | POA: Diagnosis not present

## 2016-10-20 DIAGNOSIS — Z9012 Acquired absence of left breast and nipple: Secondary | ICD-10-CM | POA: Diagnosis not present

## 2016-10-20 DIAGNOSIS — Z9221 Personal history of antineoplastic chemotherapy: Secondary | ICD-10-CM | POA: Diagnosis not present

## 2017-03-05 DIAGNOSIS — Z923 Personal history of irradiation: Secondary | ICD-10-CM | POA: Diagnosis not present

## 2017-03-05 DIAGNOSIS — Z171 Estrogen receptor negative status [ER-]: Secondary | ICD-10-CM | POA: Diagnosis not present

## 2017-03-05 DIAGNOSIS — Z853 Personal history of malignant neoplasm of breast: Secondary | ICD-10-CM | POA: Diagnosis not present

## 2017-03-05 DIAGNOSIS — Z9221 Personal history of antineoplastic chemotherapy: Secondary | ICD-10-CM | POA: Diagnosis not present

## 2017-03-05 DIAGNOSIS — Z9012 Acquired absence of left breast and nipple: Secondary | ICD-10-CM | POA: Diagnosis not present

## 2017-07-05 DIAGNOSIS — Z923 Personal history of irradiation: Secondary | ICD-10-CM | POA: Diagnosis not present

## 2017-07-05 DIAGNOSIS — Z171 Estrogen receptor negative status [ER-]: Secondary | ICD-10-CM | POA: Diagnosis not present

## 2017-07-05 DIAGNOSIS — Z9012 Acquired absence of left breast and nipple: Secondary | ICD-10-CM | POA: Diagnosis not present

## 2017-07-05 DIAGNOSIS — Z853 Personal history of malignant neoplasm of breast: Secondary | ICD-10-CM | POA: Diagnosis not present

## 2017-07-05 DIAGNOSIS — Z9221 Personal history of antineoplastic chemotherapy: Secondary | ICD-10-CM | POA: Diagnosis not present

## 2017-11-02 DIAGNOSIS — Z9012 Acquired absence of left breast and nipple: Secondary | ICD-10-CM

## 2017-11-02 DIAGNOSIS — Z171 Estrogen receptor negative status [ER-]: Secondary | ICD-10-CM | POA: Diagnosis not present

## 2017-11-02 DIAGNOSIS — Z9221 Personal history of antineoplastic chemotherapy: Secondary | ICD-10-CM | POA: Diagnosis not present

## 2017-11-02 DIAGNOSIS — Z853 Personal history of malignant neoplasm of breast: Secondary | ICD-10-CM | POA: Diagnosis not present

## 2017-11-02 DIAGNOSIS — Z923 Personal history of irradiation: Secondary | ICD-10-CM

## 2018-03-31 DIAGNOSIS — Z923 Personal history of irradiation: Secondary | ICD-10-CM | POA: Diagnosis not present

## 2018-03-31 DIAGNOSIS — Z9221 Personal history of antineoplastic chemotherapy: Secondary | ICD-10-CM

## 2018-03-31 DIAGNOSIS — Z9012 Acquired absence of left breast and nipple: Secondary | ICD-10-CM

## 2018-03-31 DIAGNOSIS — Z853 Personal history of malignant neoplasm of breast: Secondary | ICD-10-CM

## 2022-01-05 NOTE — Progress Notes (Signed)
[facility]  [address] [place],  [place]  [address] [phone]  Clinic Day:  01/06/2022  Referring physician: Bonnita Nasuti, MD  HISTORY OF PRESENT ILLNESS:  The patient is a 61 y.o. 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   She finished 4 cycles of adjuvant Taxotere/Cytoxan in January 2018 and completed her adjuvant breast radiation in March 2018.  The patient comes in today to re-establish follow-up.  Since her last visit, the patient has been doing well.  She denies having any changes in her breast/chest wall region which concern her for disease recurrence.  Of note, her annual screening mammogram done in January 2023 showed right breast asymmetry; however, a diagnostic mammogram in March 2023 showed no abnormalities.    PHYSICAL EXAM:  Blood pressure (!) 145/105, pulse 80, temperature 98.7 F (37.1 C), resp. rate 16, height '5\' 1"'$  (1.549 m), weight 208 lb 1.6 oz (94.4 kg), SpO2 91 %. Wt Readings from Last 3 Encounters:  01/06/22 208 lb 1.6 oz (94.4 kg)   Body mass index is 39.32 kg/m. Performance status (ECOG): 2 - Symptomatic, <50% confined to bed Physical Exam Constitutional:      Appearance: Normal appearance.  HENT:     Mouth/Throat:     Pharynx: Oropharynx is clear. No oropharyngeal exudate.  Cardiovascular:     Rate and Rhythm: Normal rate and regular rhythm.     Heart sounds: No murmur heard.    No friction rub. No gallop.  Pulmonary:     Breath sounds: Normal breath sounds.  Chest:  Breasts:    Right: No swelling, bleeding, inverted nipple, mass, nipple discharge or skin change.     Left: Absent. No swelling, bleeding, inverted nipple, mass, nipple discharge or skin change.  Abdominal:     General: Bowel sounds are normal. There is no distension.     Palpations: Abdomen is soft. There is no  mass.     Tenderness: There is no abdominal tenderness.  Musculoskeletal:        General: No tenderness.     Cervical back: Normal range of motion and neck supple.     Right lower leg: No edema.     Left lower leg: No edema.  Lymphadenopathy:     Cervical: No cervical adenopathy.     Right cervical: No superficial, deep or posterior cervical adenopathy.    Left cervical: No superficial, deep or posterior cervical adenopathy.     Upper Body:     Right upper body: No supraclavicular or axillary adenopathy.     Left upper body: No supraclavicular or axillary adenopathy.     Lower Body: No right inguinal adenopathy. No left inguinal adenopathy.  Skin:    Coloration: Skin is not jaundiced.     Findings: No lesion or rash.  Neurological:     General: No focal deficit present.     Mental Status: She is alert and oriented to person, place, and time. Mental status is at baseline.  Psychiatric:        Mood and Affect: Mood normal.        Behavior: Behavior normal.        Thought Content: Thought content normal.        Judgment: Judgment normal.    ASSESSMENT & PLAN:  Assessment/Plan:  A 61 y.o. 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   She finished 4 cycles of adjuvant Taxotere/Cytoxan in January 2018 and completed her adjuvant breast radiation in March 2018. Based upon her clinical breast exam today and her recent mammogram, the patient remains disease free.  Clinically, she appears to be doing well.  I will see her back in one year for a repeat clinical breast exam.  Her annual mammogram will be scheduled before her next visit for her continued radiographic breast cancer surveillance.  The patient understands all the plans discussed today and is in agreement with them.    Sanjiv Castorena Macarthur Critchley, MD

## 2022-01-06 ENCOUNTER — Other Ambulatory Visit: Payer: Self-pay | Admitting: Oncology

## 2022-01-06 ENCOUNTER — Inpatient Hospital Stay: Payer: Medicare Other

## 2022-01-06 ENCOUNTER — Telehealth: Payer: Self-pay | Admitting: Oncology

## 2022-01-06 ENCOUNTER — Inpatient Hospital Stay: Payer: Medicare Other | Attending: Oncology | Admitting: Oncology

## 2022-01-06 ENCOUNTER — Encounter: Payer: Self-pay | Admitting: Oncology

## 2022-01-06 DIAGNOSIS — Z171 Estrogen receptor negative status [ER-]: Secondary | ICD-10-CM

## 2022-01-06 DIAGNOSIS — C50412 Malignant neoplasm of upper-outer quadrant of left female breast: Secondary | ICD-10-CM | POA: Diagnosis not present

## 2022-01-06 DIAGNOSIS — Z1231 Encounter for screening mammogram for malignant neoplasm of breast: Secondary | ICD-10-CM

## 2022-01-06 DIAGNOSIS — C50212 Malignant neoplasm of upper-inner quadrant of left female breast: Secondary | ICD-10-CM

## 2022-01-06 NOTE — Telephone Encounter (Signed)
Per 01/06/22 los next appt scheduled and confirmed with patient 

## 2022-01-11 DIAGNOSIS — C50919 Malignant neoplasm of unspecified site of unspecified female breast: Secondary | ICD-10-CM | POA: Insufficient documentation

## 2023-01-06 NOTE — Progress Notes (Deleted)
[facility]  [address] [place],  [place]  [address] [phone]  Clinic Day:  01/06/2022  Referring physician: Galvin Proffer, MD  HISTORY OF PRESENT ILLNESS:  The patient is a 62 y.o. 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   She finished 4 cycles of adjuvant Taxotere/Cytoxan in January 2018 and completed her adjuvant breast radiation in March 2018.  The patient comes in today to re-establish follow-up.  Since her last visit, the patient has been doing well.  She denies having any changes in her breast/chest wall region which concern her for disease recurrence.  Of note, her annual screening mammogram for 2024 has yet to be done.  PHYSICAL EXAM:  There were no vitals taken for this visit. Wt Readings from Last 3 Encounters:  01/06/22 208 lb 1.6 oz (94.4 kg)   There is no height or weight on file to calculate BMI. Performance status (ECOG): 2 - Symptomatic, <50% confined to bed Physical Exam Constitutional:      Appearance: Normal appearance.  HENT:     Mouth/Throat:     Pharynx: Oropharynx is clear. No oropharyngeal exudate.  Cardiovascular:     Rate and Rhythm: Normal rate and regular rhythm.     Heart sounds: No murmur heard.    No friction rub. No gallop.  Pulmonary:     Breath sounds: Normal breath sounds.  Chest:  Breasts:    Right: No swelling, bleeding, inverted nipple, mass, nipple discharge or skin change.     Left: Absent. No swelling, bleeding, inverted nipple, mass, nipple discharge or skin change.  Abdominal:     General: Bowel sounds are normal. There is no distension.     Palpations: Abdomen is soft. There is no mass.     Tenderness: There is no abdominal tenderness.  Musculoskeletal:        General: No tenderness.     Cervical back: Normal range of motion and neck supple.     Right  lower leg: No edema.     Left lower leg: No edema.  Lymphadenopathy:     Cervical: No cervical adenopathy.     Right cervical: No superficial, deep or posterior cervical adenopathy.    Left cervical: No superficial, deep or posterior cervical adenopathy.     Upper Body:     Right upper body: No supraclavicular or axillary adenopathy.     Left upper body: No supraclavicular or axillary adenopathy.     Lower Body: No right inguinal adenopathy. No left inguinal adenopathy.  Skin:    Coloration: Skin is not jaundiced.     Findings: No lesion or rash.  Neurological:     General: No focal deficit present.     Mental Status: She is alert and oriented to person, place, and time. Mental status is at baseline.  Psychiatric:        Mood and Affect: Mood normal.        Behavior: Behavior normal.        Thought Content: Thought content normal.        Judgment: Judgment normal.    ASSESSMENT & PLAN:  Assessment/Plan:  A 62 y.o. 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   She finished 4 cycles  of adjuvant Taxotere/Cytoxan in January 2018 and completed her adjuvant breast radiation in March 2018. Based upon her clinical breast exam today and her recent mammogram, the patient remains disease free.  Clinically, she appears to be doing well.  I will see her back in one year for a repeat clinical breast exam.  Her annual mammogram will be scheduled before her next visit for her continued radiographic breast cancer surveillance.  The patient understands all the plans discussed today and is in agreement with them.     Kirby Funk, MD

## 2023-01-07 ENCOUNTER — Telehealth: Payer: Self-pay | Admitting: Oncology

## 2023-01-07 ENCOUNTER — Inpatient Hospital Stay: Payer: Medicare Other | Admitting: Oncology

## 2023-01-07 NOTE — Telephone Encounter (Signed)
01/07/23 Spoke with patients sister and [facility] confirming next appt.

## 2023-01-18 NOTE — Progress Notes (Unsigned)
[facility]  [address] [place],  [place]  [address] [phone]  Clinic Day:  01/19/2023  Referring physician: Galvin Proffer, MD  HISTORY OF PRESENT ILLNESS:  The patient is a 62 y.o. 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   She finished 4 cycles of adjuvant Taxotere/Cytoxan in January 2018 and completed her adjuvant breast radiation in March 2018.  The patient comes in today for routine follow-up.  Since her last visit, the patient has been doing well.  She denies having any changes in her breast/chest wall region which concern her for disease recurrence.  Of note, she has yet to undergo her annual screening mammogram this year.  PHYSICAL EXAM:  Blood pressure (!) 180/91, pulse 89, temperature 98.8 F (37.1 C), resp. rate 16, height 5\' 1"  (1.549 m), weight 200 lb 6.4 oz (90.9 kg), SpO2 95%. Wt Readings from Last 3 Encounters:  01/19/23 200 lb 6.4 oz (90.9 kg)  01/06/22 208 lb 1.6 oz (94.4 kg)   Body mass index is 37.87 kg/m. Performance status (ECOG): 2 - Symptomatic, <50% confined to bed Physical Exam Constitutional:      Appearance: Normal appearance.  HENT:     Mouth/Throat:     Pharynx: Oropharynx is clear. No oropharyngeal exudate.  Cardiovascular:     Rate and Rhythm: Normal rate and regular rhythm.     Heart sounds: No murmur heard.    No friction rub. No gallop.  Pulmonary:     Breath sounds: Normal breath sounds.  Chest:  Breasts:    Right: No swelling, bleeding, inverted nipple, mass, nipple discharge or skin change.     Left: Absent. No swelling, bleeding, inverted nipple, mass, nipple discharge or skin change.  Abdominal:     General: Bowel sounds are normal. There is no distension.     Palpations: Abdomen is soft. There is no mass.     Tenderness: There is no abdominal tenderness.   Musculoskeletal:        General: No tenderness.     Cervical back: Normal range of motion and neck supple.     Right lower leg: No edema.     Left lower leg: No edema.  Lymphadenopathy:     Cervical: No cervical adenopathy.     Right cervical: No superficial, deep or posterior cervical adenopathy.    Left cervical: No superficial, deep or posterior cervical adenopathy.     Upper Body:     Right upper body: No supraclavicular or axillary adenopathy.     Left upper body: No supraclavicular or axillary adenopathy.     Lower Body: No right inguinal adenopathy. No left inguinal adenopathy.  Skin:    Coloration: Skin is not jaundiced.     Findings: No lesion or rash.  Neurological:     General: No focal deficit present.     Mental Status: She is alert and oriented to person, place, and time. Mental status is at baseline.  Psychiatric:        Mood and Affect: Mood normal.        Behavior: Behavior normal.        Thought Content: Thought content normal.        Judgment: Judgment normal.    ASSESSMENT & PLAN:  Assessment/Plan:  A 62 y.o. female with stage IIIB (T3 N1 M0) triple negative metaplastic carcinoma  of her left breast, status post a left mastectomy and axillary lymph node dissection in September 2017.   She finished 4 cycles of adjuvant Taxotere/Cytoxan in January 2018 and completed her adjuvant breast radiation in March 2018. Based upon her clinical breast exam today, the patient remains disease free.  Clinically, she appears to be doing well.  I will arrange for her to undergo her annual mammogram within the next 4 weeks for her ontinued radiographic breast cancer surveillance.  Otherwise, I will see her back in 1 year for her next clinical breast exam.  The patient understands all the plans discussed today and is in agreement with them.    Shalinda Burkholder Kirby Funk, MD

## 2023-01-19 ENCOUNTER — Inpatient Hospital Stay: Payer: Medicare Other | Attending: Oncology | Admitting: Oncology

## 2023-01-19 VITALS — BP 180/91 | HR 89 | Temp 98.8°F | Resp 16 | Ht 61.0 in | Wt 200.4 lb

## 2023-01-19 DIAGNOSIS — Z171 Estrogen receptor negative status [ER-]: Secondary | ICD-10-CM

## 2023-01-19 DIAGNOSIS — C50112 Malignant neoplasm of central portion of left female breast: Secondary | ICD-10-CM

## 2023-01-25 ENCOUNTER — Telehealth: Payer: Self-pay

## 2023-01-25 NOTE — Telephone Encounter (Signed)
Called to follow-up on plan after last appointment , Plan reviewed, sch mammogram and f/u in 1 year.

## 2024-01-18 NOTE — Progress Notes (Deleted)
 [facility]  [address] [place],  [place]  Clinic Day:  01/19/2023  Referring physician: Pia Kerney SQUIBB, MD  HISTORY OF PRESENT ILLNESS:  The patient is a 63 y.o. 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   She finished 4 cycles of adjuvant Taxotere/Cytoxan in January 2018 and completed her adjuvant breast radiation in March 2018.  The patient comes in today for routine follow-up.  Since her last visit, the patient has been doing well.  She denies having any changes in her breast/chest wall region which concern her for disease recurrence.  Of note, she has yet to undergo her annual screening mammogram this year.  PHYSICAL EXAM:  There were no vitals taken for this visit. Wt Readings from Last 3 Encounters:  01/19/23 200 lb 6.4 oz (90.9 kg)  01/06/22 208 lb 1.6 oz (94.4 kg)   There is no height or weight on file to calculate BMI. Performance status (ECOG): 2 - Symptomatic, <50% confined to bed Physical Exam Constitutional:      Appearance: Normal appearance.  HENT:     Mouth/Throat:     Pharynx: Oropharynx is clear. No oropharyngeal exudate.  Cardiovascular:     Rate and Rhythm: Normal rate and regular rhythm.     Heart sounds: No murmur heard.    No friction rub. No gallop.  Pulmonary:     Breath sounds: Normal breath sounds.  Chest:  Breasts:    Right: No swelling, bleeding, inverted nipple, mass, nipple discharge or skin change.     Left: Absent. No swelling, bleeding, inverted nipple, mass, nipple discharge or skin change.  Abdominal:     General: Bowel sounds are normal. There is no distension.     Palpations: Abdomen is soft. There is no mass.     Tenderness: There is no abdominal tenderness.  Musculoskeletal:        General: No tenderness.     Cervical back: Normal range of  motion and neck supple.     Right lower leg: No edema.     Left lower leg: No edema.  Lymphadenopathy:     Cervical: No cervical adenopathy.     Right cervical: No superficial, deep or posterior cervical adenopathy.    Left cervical: No superficial, deep or posterior cervical adenopathy.     Upper Body:     Right upper body: No supraclavicular or axillary adenopathy.     Left upper body: No supraclavicular or axillary adenopathy.     Lower Body: No right inguinal adenopathy. No left inguinal adenopathy.  Skin:    Coloration: Skin is not jaundiced.     Findings: No lesion or rash.  Neurological:     General: No focal deficit present.     Mental Status: She is alert and oriented to person, place, and time. Mental status is at baseline.  Psychiatric:        Mood and Affect: Mood normal.        Behavior: Behavior normal.        Thought Content: Thought content normal.        Judgment: Judgment normal.    ASSESSMENT & PLAN:  Assessment/Plan:  A 63 y.o. 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   She finished 4 cycles of adjuvant Taxotere/Cytoxan in January 2018 and completed her adjuvant breast radiation in March 2018. Based upon her clinical breast exam today, the patient remains disease free.  Clinically, she appears to be doing well.  I will arrange for her to undergo her annual mammogram within the next 4 weeks for her ontinued radiographic breast cancer surveillance.  Otherwise, I will see her back in 1 year for her next clinical breast exam.  The patient understands all the plans discussed today and is in agreement with them.    Kase Shughart DELENA Kerns, MD

## 2024-01-19 ENCOUNTER — Telehealth: Payer: Self-pay | Admitting: Oncology

## 2024-01-19 ENCOUNTER — Inpatient Hospital Stay: Payer: Medicare Other | Admitting: Oncology

## 2024-01-19 NOTE — Telephone Encounter (Signed)
 01/19/24 Spoke with [facility] and reschedule appt.

## 2024-02-15 ENCOUNTER — Inpatient Hospital Stay: Attending: Oncology | Admitting: Oncology

## 2024-02-15 NOTE — Progress Notes (Deleted)
 [facility]  [address] [place],  [place]  [address] [phone]  Clinic Day:  02/15/2024  Referring physician: Pia Kerney SQUIBB, MD  HISTORY OF PRESENT ILLNESS:  The patient is a 63 y.o. 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   She finished 4 cycles of adjuvant Taxotere/Cytoxan in January 2018 and completed her adjuvant breast radiation in March 2018.  The patient comes in today for routine follow-up.  Since her last visit, the patient has been doing well.  She denies having any changes in her breast/chest wall region which concern her for disease recurrence.  Of note, she has yet to undergo her annual screening mammogram this year.  PHYSICAL EXAM:  There were no vitals taken for this visit. Wt Readings from Last 3 Encounters:  01/19/23 200 lb 6.4 oz (90.9 kg)  01/06/22 208 lb 1.6 oz (94.4 kg)   There is no height or weight on file to calculate BMI. Performance status (ECOG): 2 - Symptomatic, <50% confined to bed Physical Exam Constitutional:      Appearance: Normal appearance.  HENT:     Mouth/Throat:     Pharynx: Oropharynx is clear. No oropharyngeal exudate.  Cardiovascular:     Rate and Rhythm: Normal rate and regular rhythm.     Heart sounds: No murmur heard.    No friction rub. No gallop.  Pulmonary:     Breath sounds: Normal breath sounds.  Chest:  Breasts:    Right: No swelling, bleeding, inverted nipple, mass, nipple discharge or skin change.     Left: Absent. No swelling, bleeding, inverted nipple, mass, nipple discharge or skin change.  Abdominal:     General: Bowel sounds are normal. There is no distension.     Palpations: Abdomen is soft. There is no mass.     Tenderness: There is no abdominal tenderness.  Musculoskeletal:        General: No tenderness.     Cervical back: Normal range of  motion and neck supple.     Right lower leg: No edema.     Left lower leg: No edema.  Lymphadenopathy:     Cervical: No cervical adenopathy.     Right cervical: No superficial, deep or posterior cervical adenopathy.    Left cervical: No superficial, deep or posterior cervical adenopathy.     Upper Body:     Right upper body: No supraclavicular or axillary adenopathy.     Left upper body: No supraclavicular or axillary adenopathy.     Lower Body: No right inguinal adenopathy. No left inguinal adenopathy.  Skin:    Coloration: Skin is not jaundiced.     Findings: No lesion or rash.  Neurological:     General: No focal deficit present.     Mental Status: She is alert and oriented to person, place, and time. Mental status is at baseline.  Psychiatric:        Mood and Affect: Mood normal.        Behavior: Behavior normal.        Thought Content: Thought content normal.        Judgment: Judgment normal.    ASSESSMENT & PLAN:  Assessment/Plan:  A 63 y.o. 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   She finished 4 cycles of adjuvant Taxotere/Cytoxan in January 2018 and completed her adjuvant breast radiation in March 2018. Based upon her clinical breast exam today, the patient remains disease free.  Clinically, she appears to be doing well.  I will arrange for her to undergo her annual mammogram within the next 4 weeks for her ontinued radiographic breast cancer surveillance.  Otherwise, I will see her back in 1 year for her next clinical breast exam.  The patient understands all the plans discussed today and is in agreement with them.    [name] DELENA Kerns, MD
